# Patient Record
Sex: Male | Born: 1970 | Race: White | Hispanic: No | Marital: Single
Health system: Southern US, Community
[De-identification: ages and names within clinical notes are randomized; demographics above are authoritative.]

---

## 2006-10-02 ENCOUNTER — Other Ambulatory Visit: Payer: Self-pay

## 2006-10-02 ENCOUNTER — Emergency Department: Payer: Self-pay | Admitting: Emergency Medicine

## 2006-10-03 ENCOUNTER — Emergency Department: Payer: Self-pay | Admitting: Emergency Medicine

## 2006-10-03 ENCOUNTER — Other Ambulatory Visit: Payer: Self-pay

## 2008-09-26 IMAGING — CR DG CHEST 2V
1 series · 2 of 2 positions shown · non-contrast
Comparison: none

REASON FOR EXAM: R lower chest pain
COMMENTS:

PROCEDURE:     DXR - DXR CHEST PA (OR AP) AND LATERAL  - October 03, 2006 [DATE]
RESULT:      AP and lateral view reveals the cardiomediastinal structures to
be within normal limits. The lung fields are clear. The vascularity is
within normal limits.  No effusions are identified.

[Series 1: view not recorded · 0.17mm/px · 2 of 2 slices shown]
[im 1/2]
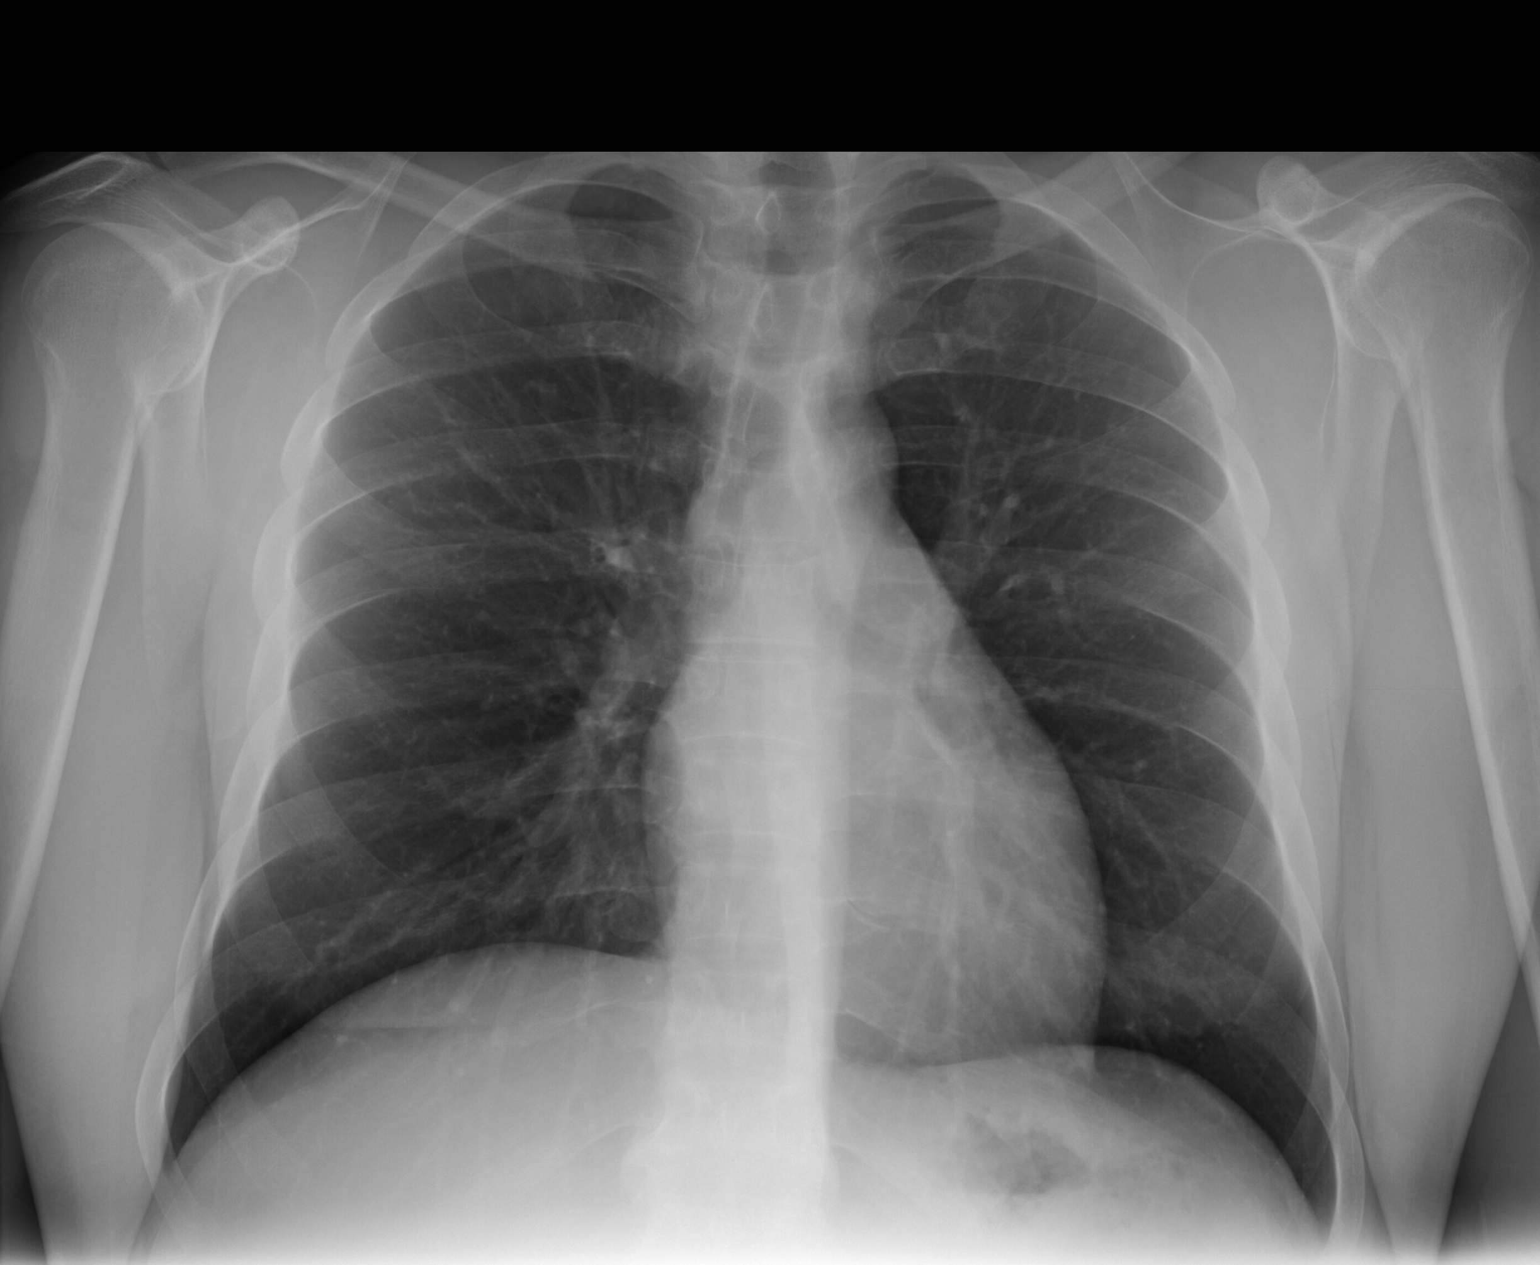
[im 2/2]
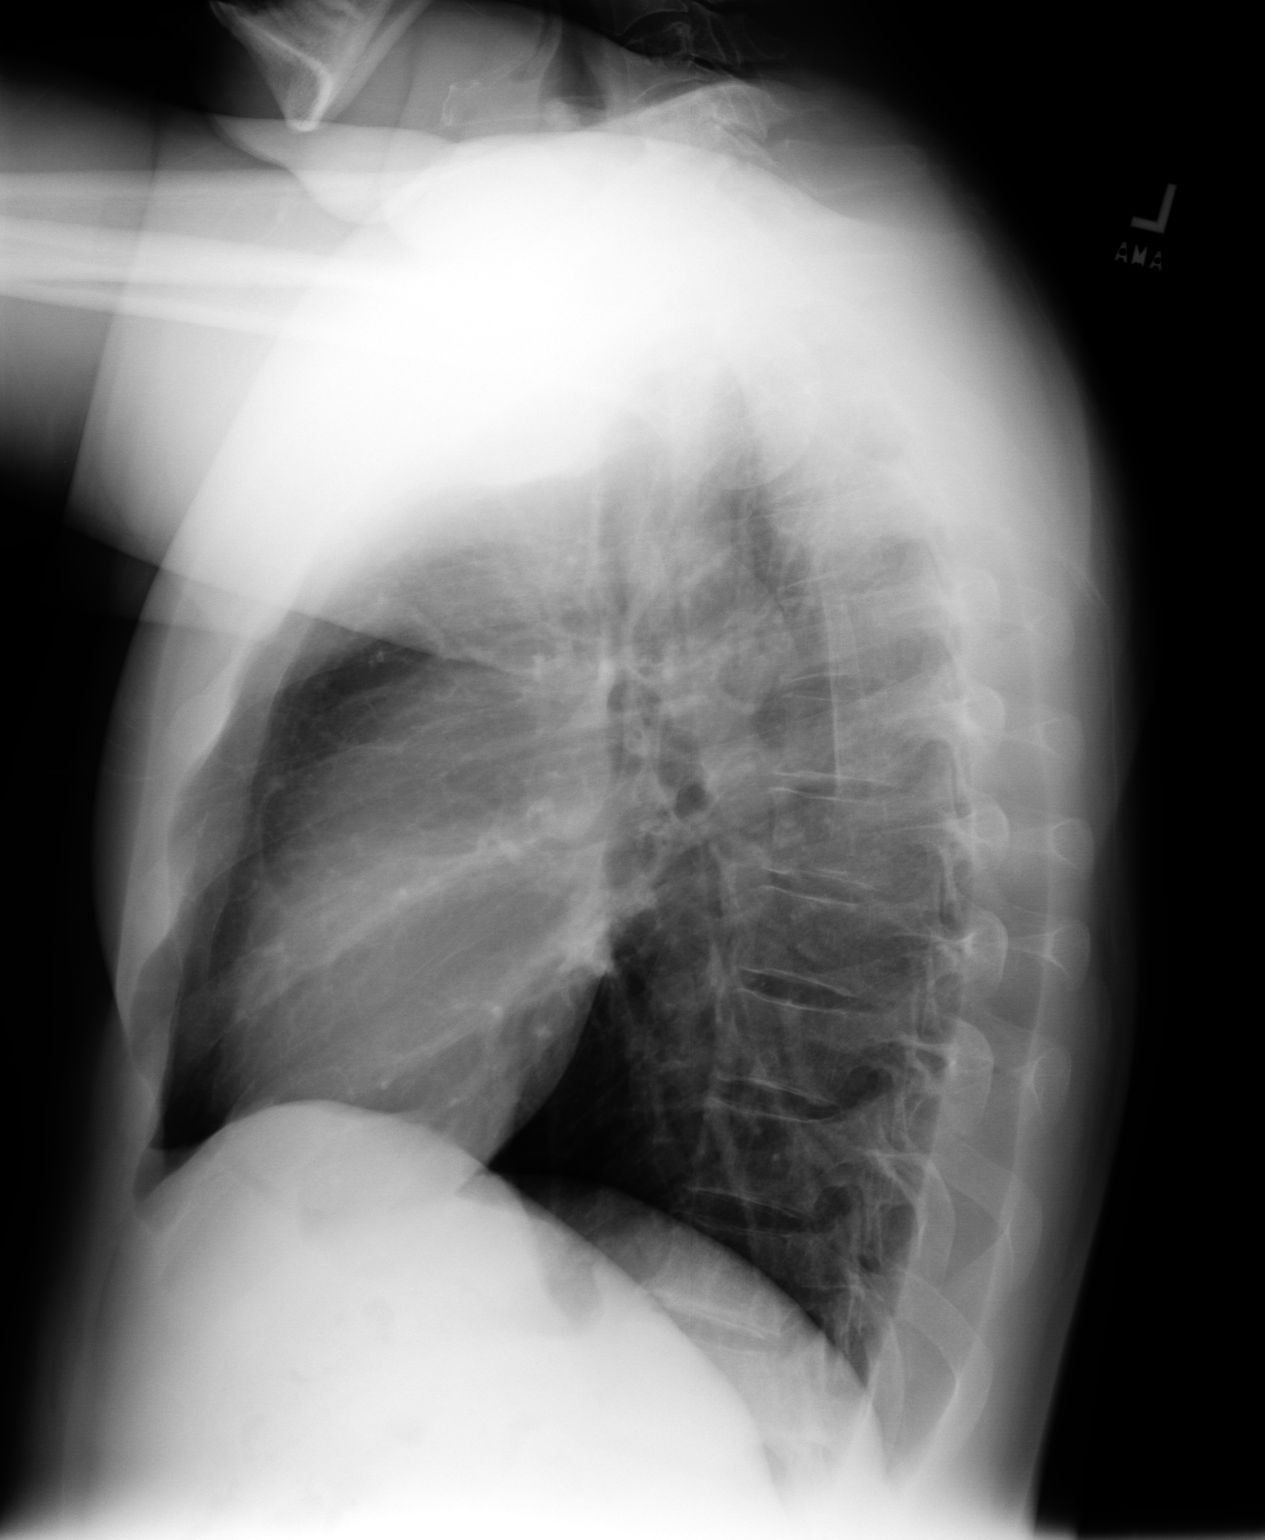

[2 of 2 positions shown; findings below may reference images not displayed]

IMPRESSION: The lung fields are clear.

## 2018-09-07 ENCOUNTER — Ambulatory Visit
Admission: EM | Admit: 2018-09-07 | Discharge: 2018-09-07 | Disposition: A | Discharge status: Home / Self Care | Payer: BLUE CROSS/BLUE SHIELD | Attending: Family Medicine | Admitting: Family Medicine

## 2018-09-07 DIAGNOSIS — R21 Rash and other nonspecific skin eruption: Secondary | ICD-10-CM

## 2018-09-07 MED ORDER — CLOTRIMAZOLE-BETAMETHASONE 1-0.05 % EX CREA: TOPICAL_CREAM | CUTANEOUS | 0 refills | Status: AC

## 2018-09-07 NOTE — ED Provider Notes (Signed)
[facility]    CSN: [id number] Arrival date & time: 09/07/18  1343  History   Chief Complaint Chief Complaint  Patient presents with  . Rash   HPI  48 year old male presents with persistent rash.  Patient was seen at [facility] urgent care on 1/27.  Diagnosed with tinea corporis.  Placed on Lamisil and topical econazole.  Patient states that he has failed to improve.  In fact, he is worsening.  He has rash on his left calf as well as the inner thighs bilaterally.  He states that it itches when it is hot or moist.  He states that he has not improved with the prescribed treatment.  He has been compliant.  No known inciting factor.  No relieving factors.  No other complaints.  History reviewed and updated as below.  PMH: Allergies  Surgical Hx: Foot surgery  Social history: Non-smoker. Home Medications    Prior to Admission medications   Medication Sig Start Date End Date Taking? Authorizing Provider  econazole nitrate 1 % cream Apply topically daily.   Yes [provider]  terbinafine (LAMISIL) 250 MG tablet Take 250 mg by mouth daily.   Yes [provider]  clotrimazole-betamethasone (LOTRISONE) cream Apply to affected area 2 times daily 09/07/18   Tommie Sams, DO   Allergies   Patient has no allergy information on record.   Review of Systems Review of Systems  Constitutional: Negative.   Skin: Positive for rash.   Physical Exam Triage Vital Signs ED Triage Vitals  Enc Vitals Group     BP 09/07/18 1406 115/84     Pulse Rate 09/07/18 1405 (!) 104     Resp 09/07/18 1406 16     Temp 09/07/18 1405 98.1 F (36.7 C)     Temp Source 09/07/18 1405 Oral     SpO2 09/07/18 1406 99 %     Weight 09/07/18 1407 215 lb (97.5 kg)     Height 09/07/18 1407 5\' 9"  (1.753 m)     Head Circumference --      Peak Flow --      Pain Score 09/07/18 1407 0     Pain Loc --      Pain Edu? --      Excl. in GC? --    Updated Vital Signs BP 115/84   Pulse (!) 104    Temp 98.1 F (36.7 C) (Oral)   Resp 16   Ht 5\' 9"  (1.753 m)   Wt 97.5 kg   SpO2 99%   BMI 31.75 kg/m    Visual Acuity Right Eye Distance:   Left Eye Distance:   Bilateral Distance:    Right Eye Near:   Left Eye Near:    Bilateral Near:     Physical Exam Vitals signs and nursing note reviewed.  Constitutional:      General: He is not in acute distress.    Appearance: Normal appearance.  HENT:     Head: Normocephalic and atraumatic.  Eyes:     General: No scleral icterus.    Conjunctiva/sclera: Conjunctivae normal.  Pulmonary:     Effort: Pulmonary effort is normal. No respiratory distress.  Skin:    Comments: Left calf - large area of erythema and dryness (raised).  Medial thighs - erythematous rash with raised borders.   Neurological:     Mental Status: He is alert.  Psychiatric:        Mood and Affect: Mood normal.  Behavior: Behavior normal.    UC Treatments / Results  Labs (all labs ordered are listed, but only abnormal results are displayed) Labs Reviewed - No data to display  EKG None  Radiology No results found.  Procedures Procedures (including critical care time)  Medications Ordered in UC Medications - No data to display  Initial Impression / Assessment and Plan / UC Course  I have reviewed the triage vital signs and the nursing notes.  Pertinent labs & imaging results that were available during my care of the patient were reviewed by me and considered in my medical decision making (see chart for details).    48 year old male presents with persistent rash.  Appears to be fungal. Has not responded to previous treatment.  As a result, I am placing him on Lotrisone.  He needs to see dermatology if he feel fails to improve or worsens.  Final Clinical Impressions(s) / UC Diagnoses   Final diagnoses:  Rash     Discharge Instructions     Medication as prescribed.  If persists, see dermatology. Recommend [facility] dermatology.  Take  care  Dr. Adriana Simas    ED Prescriptions    Medication Sig Dispense Auth. Provider   clotrimazole-betamethasone (LOTRISONE) cream Apply to affected area 2 times daily 45 g Tommie Sams, DO     Controlled Substance Prescriptions [place] Controlled Substance Registry consulted? Not Applicable   Tommie Sams, DO 09/07/18 1557

## 2018-09-07 NOTE — ED Triage Notes (Signed)
Pt reports rash to left calf that started about 3-4 months ago after going under a house. Pt reports seen for same about 1 month ago and prescribed cream and oral medication which completed. Pt describes rash as itchy

## 2018-09-07 NOTE — Discharge Instructions (Signed)
Medication as prescribed.  If persists, see dermatology. Recommend [facility] dermatology.  Take care  Dr. Adriana Simas

## 2020-01-04 ENCOUNTER — Ambulatory Visit: Payer: BLUE CROSS/BLUE SHIELD | Attending: Internal Medicine

## 2020-01-04 DIAGNOSIS — Z23 Encounter for immunization: Secondary | ICD-10-CM

## 2020-01-04 NOTE — Progress Notes (Signed)
   Covid-19 Vaccination Clinic  Name:  Ricardo Hayes    MRN: [id number] DOB: 1971-05-20  01/04/2020  Ricardo Hayes was observed post Covid-19 immunization for 15 minutes without incident. He was provided with Vaccine Information Sheet and instruction to access the V-Safe system.   Ricardo Hayes was instructed to call 911 with any severe reactions post vaccine: Marland Kitchen Difficulty breathing  . Swelling of face and throat  . A fast heartbeat  . A bad rash all over body  . Dizziness and weakness   Immunizations Administered    Name Date Dose VIS Date Route   Pfizer COVID-19 Vaccine 01/04/2020  8:21 AM 0.3 mL 08/29/2018 Intramuscular   Manufacturer: [employer], [employer]   Lot: [id number]   NDC: [address]-8

## 2020-01-25 ENCOUNTER — Ambulatory Visit: Payer: BLUE CROSS/BLUE SHIELD | Attending: Internal Medicine

## 2020-01-25 DIAGNOSIS — Z23 Encounter for immunization: Secondary | ICD-10-CM

## 2020-01-25 NOTE — Progress Notes (Signed)
   Covid-19 Vaccination Clinic  Name:  BRADDEN TADROS    MRN: [id number] DOB: 02-16-1971  01/25/2020  Mr. Desroches was observed post Covid-19 immunization for 15 minutes without incident. He was provided with Vaccine Information Sheet and instruction to access the V-Safe system.   Mr. Heupel was instructed to call 911 with any severe reactions post vaccine: Marland Kitchen Difficulty breathing  . Swelling of face and throat  . A fast heartbeat  . A bad rash all over body  . Dizziness and weakness   Immunizations Administered    Name Date Dose VIS Date Route   Pfizer COVID-19 Vaccine 01/25/2020  9:14 AM 0.3 mL 08/29/2018 Intramuscular   Manufacturer: [employer], [employer]   Lot: [id number]   NDC: [address]-0
